# Patient Record
Sex: Female | Born: 1995 | Race: Black or African American | Hispanic: No | Marital: Single | Smoking: Never smoker
Health system: Southern US, Community
[De-identification: ages and names within clinical notes are randomized; demographics above are authoritative.]

## PROBLEM LIST (undated history)

## (undated) DIAGNOSIS — J45909 Unspecified asthma, uncomplicated: Secondary | ICD-10-CM

## (undated) DIAGNOSIS — Z68.41 Body mass index (BMI) pediatric, greater than or equal to 95th percentile for age: Secondary | ICD-10-CM

## (undated) DIAGNOSIS — E669 Obesity, unspecified: Secondary | ICD-10-CM

## (undated) DIAGNOSIS — R51 Headache: Secondary | ICD-10-CM

## (undated) HISTORY — PX: TONSILLECTOMY: SUR1361

## (undated) HISTORY — DX: Obesity, unspecified: E66.9

## (undated) HISTORY — DX: Body mass index (bmi) pediatric, greater than or equal to 95th percentile for age: Z68.54

## (undated) HISTORY — PX: TONSILLECTOMY/ADENOIDECTOMY/TURBINATE REDUCTION: SHX6126

## (undated) HISTORY — DX: Headache: R51

---
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
## 2018-11-19 IMAGING — CR DG CHEST 2V
2 series · 2 of 2 positions shown · non-contrast
Comparison: Radiographs January 14, 2008.

CLINICAL DATA: Chest pain.

EXAM:
CHEST  2 VIEW

[w chest pa]
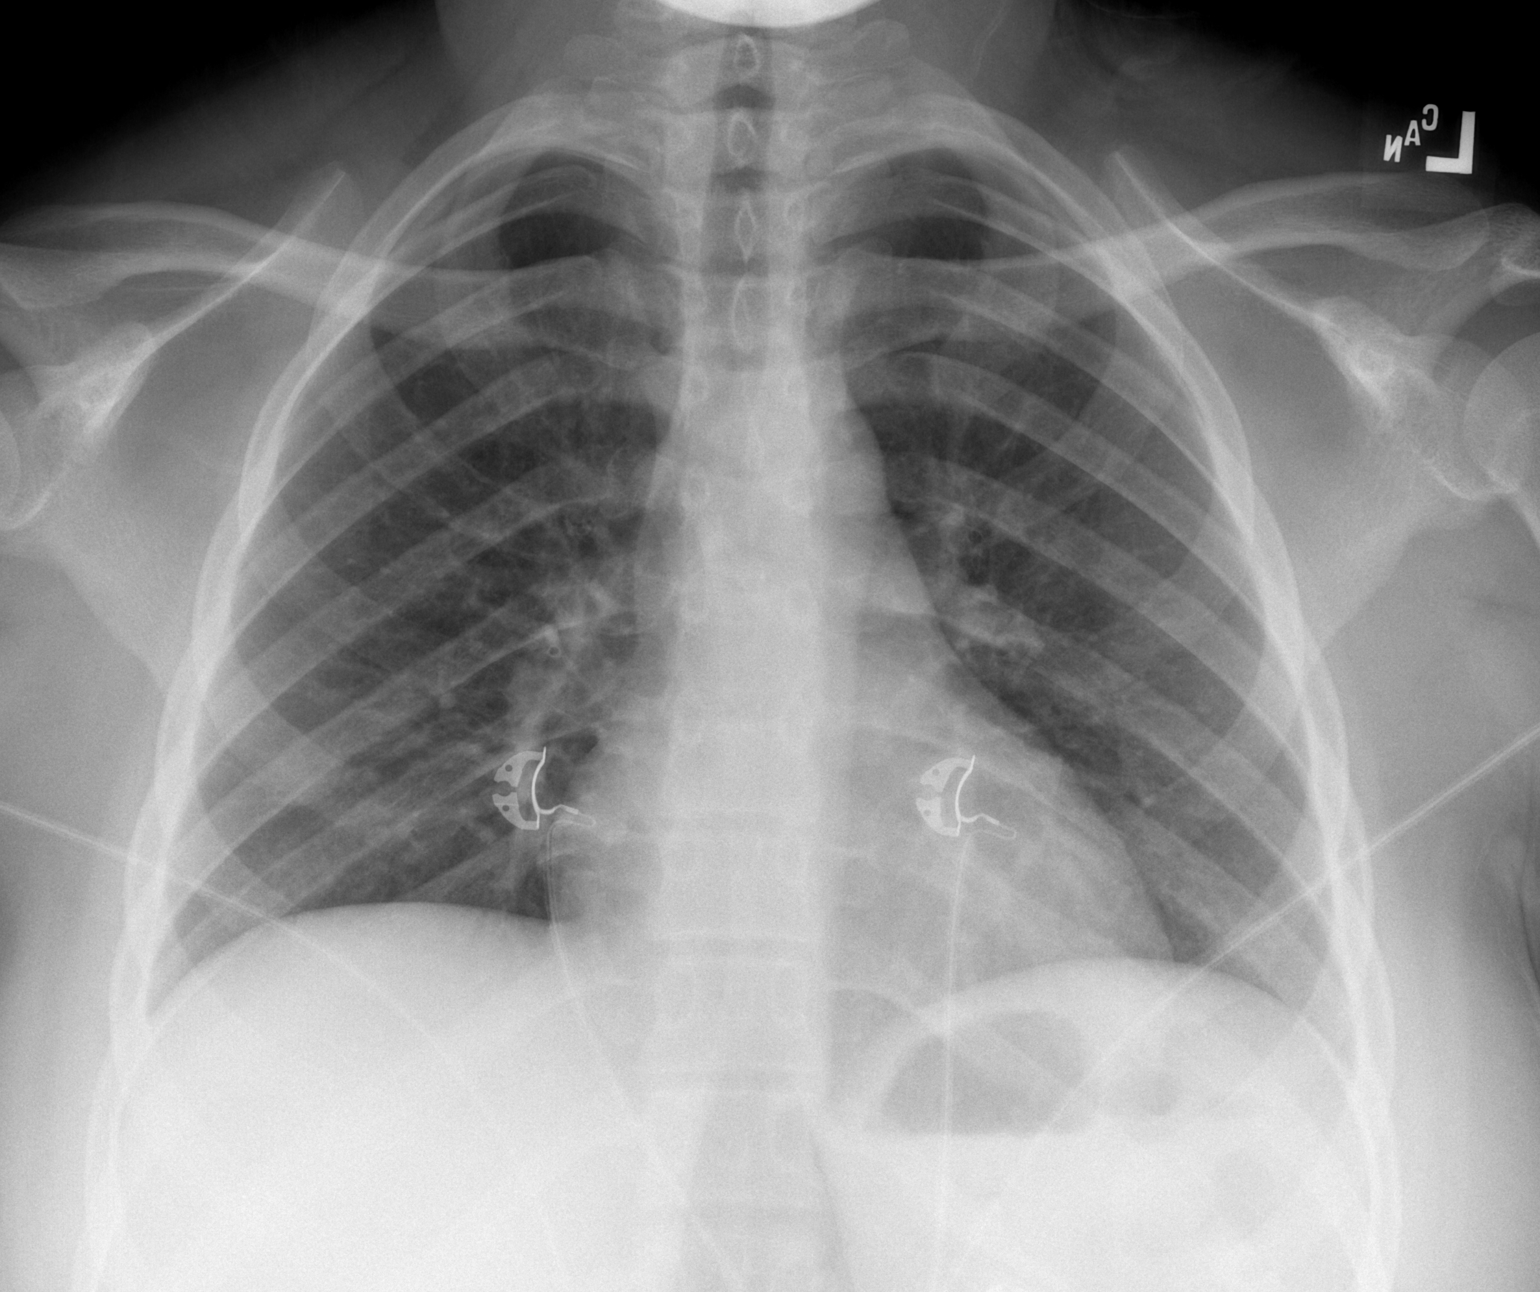

[w chest lat]
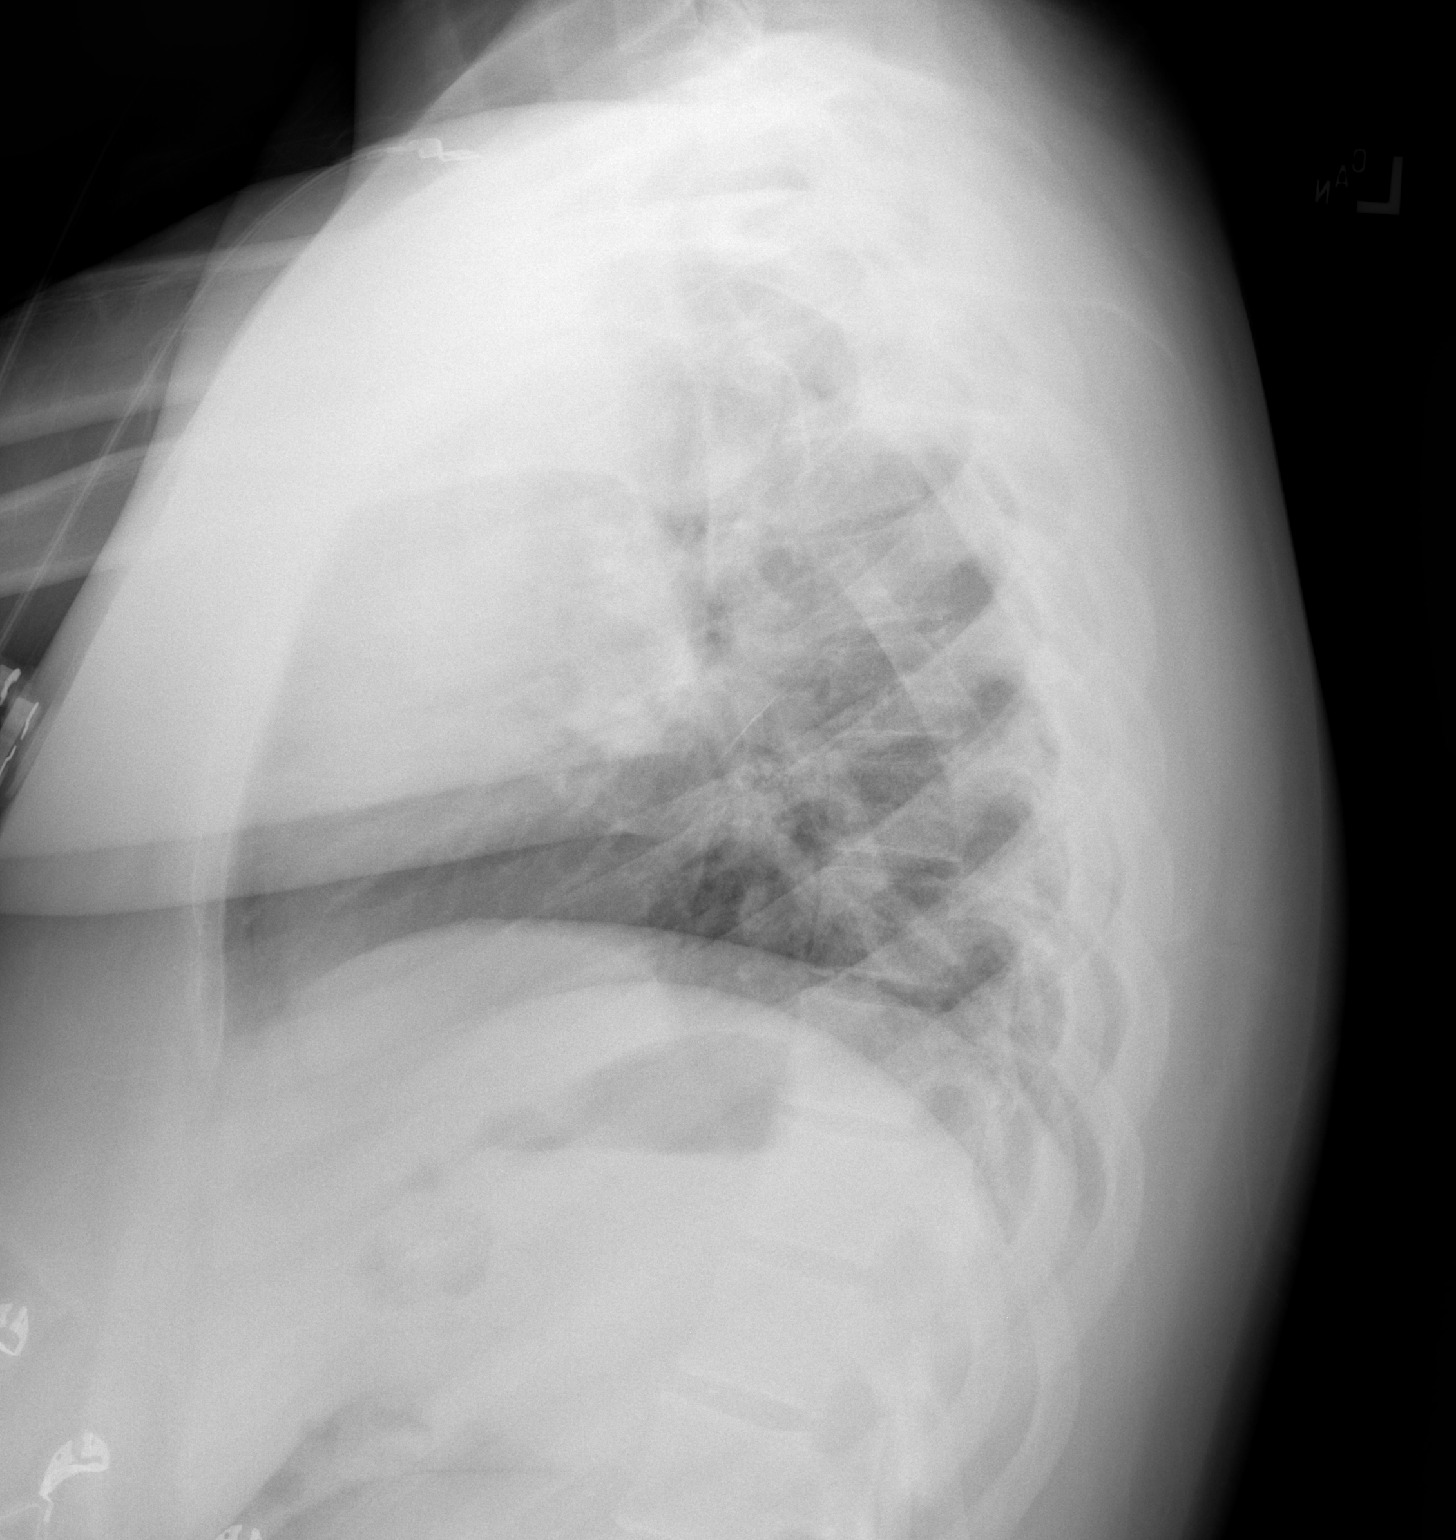

[2 of 2 positions shown; findings below may reference images not displayed]

FINDINGS: The heart size and mediastinal contours are within normal limits.
Both lungs are clear. No pneumothorax or pleural effusion is noted.
The visualized skeletal structures are unremarkable.
IMPRESSION: No active cardiopulmonary disease.
